# Patient Record
Sex: Female | Born: 1990 | Race: Black or African American | Hispanic: No | Marital: Single | Smoking: Never smoker
Health system: Southern US, Community
[De-identification: ages and names within clinical notes are randomized; demographics above are authoritative.]

## PROBLEM LIST (undated history)

## (undated) HISTORY — PX: TONSILLECTOMY: SUR1361

## (undated) HISTORY — PX: CLEFT PALATE REPAIR: SUR1165

---

## 2014-07-31 ENCOUNTER — Encounter (HOSPITAL_COMMUNITY): Payer: Self-pay | Admitting: Emergency Medicine

## 2014-07-31 ENCOUNTER — Emergency Department (HOSPITAL_COMMUNITY)
Admission: EM | Admit: 2014-07-31 | Discharge: 2014-07-31 | Discharge status: Against Medical Advice | Payer: 59 | Attending: Emergency Medicine | Admitting: Emergency Medicine

## 2014-07-31 DIAGNOSIS — H9209 Otalgia, unspecified ear: Secondary | ICD-10-CM | POA: Insufficient documentation

## 2014-07-31 DIAGNOSIS — R51 Headache: Secondary | ICD-10-CM | POA: Diagnosis not present

## 2014-07-31 NOTE — ED Notes (Signed)
Pt c/o right side ear pain and headache x 2 days. Denies n/v.

## 2014-07-31 NOTE — ED Notes (Signed)
Bed: WA06 Expected date:  Expected time:  Means of arrival:  Comments: 

## 2014-07-31 NOTE — ED Notes (Signed)
Pt not in exam room x 20 mins; not in waiting room x 4 attempts; walked through waiting room calling for patient

## 2014-10-26 ENCOUNTER — Emergency Department (HOSPITAL_COMMUNITY): Payer: 59

## 2014-10-26 ENCOUNTER — Emergency Department (HOSPITAL_COMMUNITY)
Admission: EM | Admit: 2014-10-26 | Discharge: 2014-10-26 | Disposition: A | Discharge status: Home / Self Care | Payer: 59 | Attending: Emergency Medicine | Admitting: Emergency Medicine

## 2014-10-26 ENCOUNTER — Encounter (HOSPITAL_COMMUNITY): Payer: Self-pay

## 2014-10-26 DIAGNOSIS — S1081XA Abrasion of other specified part of neck, initial encounter: Secondary | ICD-10-CM | POA: Diagnosis not present

## 2014-10-26 DIAGNOSIS — Y9389 Activity, other specified: Secondary | ICD-10-CM | POA: Diagnosis not present

## 2014-10-26 DIAGNOSIS — S01511A Laceration without foreign body of lip, initial encounter: Secondary | ICD-10-CM | POA: Insufficient documentation

## 2014-10-26 DIAGNOSIS — Y998 Other external cause status: Secondary | ICD-10-CM | POA: Diagnosis not present

## 2014-10-26 DIAGNOSIS — S0083XA Contusion of other part of head, initial encounter: Secondary | ICD-10-CM

## 2014-10-26 DIAGNOSIS — Y9289 Other specified places as the place of occurrence of the external cause: Secondary | ICD-10-CM | POA: Insufficient documentation

## 2014-10-26 MED ORDER — SODIUM BICARBONATE 4 % IV SOLN
5.0000 mL | Freq: Once | INTRAVENOUS | Status: AC
  Administered 2014-10-26: 5 mL via SUBCUTANEOUS
  Filled 2014-10-26: qty 5

## 2014-10-26 MED ORDER — LIDOCAINE HCL (PF) 1 % IJ SOLN
5.0000 mL | Freq: Once | INTRAMUSCULAR | Status: AC
  Administered 2014-10-26: 5 mL
  Filled 2014-10-26: qty 5
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
## 2014-10-26 NOTE — ED Notes (Signed)
MD at bedside. EDP PICKERING PRESENT SUTURING

## 2014-10-26 NOTE — ED Notes (Signed)
MD at bedside.EDP PICKERING PRESENT TO EVALUATE THIS PT

## 2014-10-26 NOTE — Discharge Instructions (Signed)
Follow with your orthodontist about the braces.  Absorbable Suture Repair Absorbable sutures (stitches) hold skin together so you can heal. Keep skin wounds clean and dry for the next 2 to 3 days. Then, you may gently wash your wound and dress it with an antibiotic ointment as recommended. As your wound begins to heal, the sutures are no longer needed, and they typically begin to fall off. This will take 7 to 10 days. After 10 days, if your sutures are loose, you can remove them by wiping with a clean gauze pad or a cotton ball. Do not pull your sutures out. They should wipe away easily. If after 10 days they do not easily wipe away, have your caregiver take them out. Absorbable sutures may be used deep in a wound to help hold it together. If these stitches are below the skin, the body will absorb them completely in 3 to 4 weeks.  You may need a tetanus shot if:  You cannot remember when you had your last tetanus shot.  You have never had a tetanus shot. If you get a tetanus shot, your arm may swell, get red, and feel warm to the touch. This is common and not a problem. If you need a tetanus shot and you choose not to have one, there is a rare chance of getting tetanus. Sickness from tetanus can be serious. SEEK IMMEDIATE MEDICAL CARE IF:  You have redness in the wound area.  The wound area feels hot to the touch.  You develop swelling in the wound area.  You develop pain.  There is fluid drainage from the wound. Document Released: 12/04/2004 Document Revised: 01/19/2012 Document Reviewed: 03/18/2011 [facility] Patient Information 2015 [employer] [place]. This information is not intended to replace advice given to you by your health care provider. Make sure you discuss any questions you have with your health care provider.
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
## 2019-04-07 DIAGNOSIS — Z6825 Body mass index (BMI) 25.0-25.9, adult: Secondary | ICD-10-CM | POA: Diagnosis not present

## 2019-04-07 DIAGNOSIS — Z118 Encounter for screening for other infectious and parasitic diseases: Secondary | ICD-10-CM | POA: Diagnosis not present

## 2019-04-07 DIAGNOSIS — Z113 Encounter for screening for infections with a predominantly sexual mode of transmission: Secondary | ICD-10-CM | POA: Diagnosis not present

## 2019-04-07 DIAGNOSIS — Z01419 Encounter for gynecological examination (general) (routine) without abnormal findings: Secondary | ICD-10-CM | POA: Diagnosis not present

## 2019-04-07 DIAGNOSIS — Z114 Encounter for screening for human immunodeficiency virus [HIV]: Secondary | ICD-10-CM | POA: Diagnosis not present

## 2019-04-07 DIAGNOSIS — Z1159 Encounter for screening for other viral diseases: Secondary | ICD-10-CM | POA: Diagnosis not present
